# Patient Record
Sex: Male | Born: 1955 | Race: White | Hispanic: No | Marital: Married | Smoking: Current some day smoker
Health system: Southern US, Community
[De-identification: ages and names within clinical notes are randomized; demographics above are authoritative.]

## PROBLEM LIST (undated history)

## (undated) HISTORY — PX: APPENDECTOMY: SHX54

## (undated) HISTORY — PX: SHOULDER SURGERY: SHX246

## (undated) HISTORY — PX: CHOLECYSTECTOMY: SHX55

---

## 2017-09-22 ENCOUNTER — Ambulatory Visit (INDEPENDENT_AMBULATORY_CARE_PROVIDER_SITE_OTHER): Payer: Self-pay

## 2017-09-22 ENCOUNTER — Encounter: Payer: Self-pay | Admitting: *Deleted

## 2017-09-22 ENCOUNTER — Ambulatory Visit
Admission: EM | Admit: 2017-09-22 | Discharge: 2017-09-22 | Disposition: A | Discharge status: Home / Self Care | Payer: Self-pay | Attending: Family Medicine | Admitting: Family Medicine

## 2017-09-22 DIAGNOSIS — R0981 Nasal congestion: Secondary | ICD-10-CM

## 2017-09-22 DIAGNOSIS — J0100 Acute maxillary sinusitis, unspecified: Secondary | ICD-10-CM

## 2017-09-22 DIAGNOSIS — R05 Cough: Secondary | ICD-10-CM

## 2017-09-22 DIAGNOSIS — R059 Cough, unspecified: Secondary | ICD-10-CM

## 2017-09-22 LAB — RAPID STREP SCREEN (MED CTR MEBANE ONLY): Streptococcus, Group A Screen (Direct): NEGATIVE

## 2017-09-22 MED ORDER — PREDNISONE 10 MG PO TABS: ORAL_TABLET | ORAL | 0 refills | Status: AC

## 2017-09-22 MED ORDER — ALBUTEROL SULFATE HFA 108 (90 BASE) MCG/ACT IN AERS: 2.0000 | INHALATION_SPRAY | Freq: Four times a day (QID) | RESPIRATORY_TRACT | 0 refills | Status: AC | PRN

## 2017-09-22 MED ORDER — DOXYCYCLINE HYCLATE 100 MG PO CAPS: 100.0000 mg | ORAL_CAPSULE | Freq: Two times a day (BID) | ORAL | 0 refills | Status: AC

## 2017-09-22 MED ORDER — BENZONATATE 100 MG PO CAPS: 100.0000 mg | ORAL_CAPSULE | Freq: Three times a day (TID) | ORAL | 0 refills | Status: AC | PRN

## 2017-09-22 NOTE — Discharge Instructions (Signed)
Take medication as prescribed. Rest. Drink plenty of fluids.  ° °Follow up with your primary care physician this week as needed. Return to Urgent care for new or worsening concerns.  ° °

## 2017-09-22 NOTE — ED Provider Notes (Signed)
[facility] ____________________________________________  Time seen: Approximately 10:43 AM  I have reviewed the triage vital signs and the nursing notes.   HISTORY  Chief Complaint Cough   HPI Kenneth Simon is a 60 y.o. male presenting for evaluation of 9 days of runny nose, nasal congestion, sinus pressure, postnasal drainage, cough and body aches.  Patient reports gradual onset of sinus pressure sensation in his cheeks as well as worsening cough.  States cough is often productive of thick greenish mucus as well as when blowing his nose.  States symptoms unresolved with over-the-counter mucus medication.  Denies known fevers, but reports does have some body aches and chills.  Also states some sore throat, described as mild at this time.  States some intermittent chest tightness sensation with coughing and some soreness in bilateral ribs when coughing, but denies chest pain or shortness of breath.  Denies hemoptysis.  Denies known sick contacts.  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  Denies other aggravating or alleviating factors.  Chronic smoker.  Denies history of chronic bronchitis or COPD.  Reports overall continues to eat and drink well, though appetite is decreased.  Denies recent sickness or recent antibiotic use.  Denies chest pain, shortness of breath, abdominal pain, dysuria, extremity pain, extremity swelling or rash.    History reviewed. No pertinent past medical history.  There are no active problems to display for this patient.   Past Surgical History:  Procedure Laterality Date  . APPENDECTOMY    . CHOLECYSTECTOMY    . SHOULDER SURGERY       No current facility-administered medications for this encounter.   Current Outpatient Medications:  .  albuterol (PROVENTIL HFA;VENTOLIN HFA) 108 (90 Base) MCG/ACT inhaler, Inhale 2 puffs every 6 (six) hours as needed into the lungs for wheezing or shortness of breath., Disp: 1  Inhaler, Rfl: 0 .  benzonatate (TESSALON PERLES) 100 MG capsule, Take 1 capsule (100 mg total) 3 (three) times daily as needed by mouth for cough., Disp: 15 capsule, Rfl: 0 .  doxycycline (VIBRAMYCIN) 100 MG capsule, Take 1 capsule (100 mg total) 2 (two) times daily by mouth., Disp: 20 capsule, Rfl: 0 .  predniSONE (DELTASONE) 10 MG tablet, Start 60 mg po day one, then 50 mg po day two, taper by 10 mg daily until complete., Disp: 21 tablet, Rfl: 0  Allergies Patient has no allergy information on record.  History reviewed. No pertinent family history.  Social History Social History   Tobacco Use  . Smoking status: Current Some Day Smoker    Packs/day: 1.00    Types: Cigarettes  . Smokeless tobacco: Never Used  Substance Use Topics  . Alcohol use: No    Frequency: Never  . Drug use: No    Review of Systems Constitutional: As above ENT: Positive sore throat. Cardiovascular: Denies chest pain. Respiratory: Denies shortness of breath. Gastrointestinal: No abdominal pain.  No nausea, no vomiting.  Genitourinary: Negative for dysuria. Musculoskeletal: Negative for back pain. Skin: Negative for rash.   ____________________________________________   PHYSICAL EXAM:  VITAL SIGNS: ED Triage Vitals  Enc Vitals Group     BP 09/22/17 1011 133/90     Pulse Rate 09/22/17 1011 61     Resp 09/22/17 1011 12     Temp 09/22/17 1011 97.8 F (36.6 C)     Temp Source 09/22/17 1011 Oral     SpO2 09/22/17 1011 97 %     Weight 09/22/17 1013 237 lb (107.5  kg)     Height 09/22/17 1013 5\' 10"  (1.778 m)     Head Circumference --      Peak Flow --      Pain Score 09/22/17 1013 6     Pain Loc --      Pain Edu? --      Excl. in GC? --     Constitutional: Alert and oriented. Well appearing and in no acute distress. Eyes: Conjunctivae are normal.  Head: Atraumatic.Mild tenderness to palpation bilateral frontal and maxillary sinuses. No swelling. No erythema.   Ears: no erythema, normal TMs  bilaterally.   Nose: nasal congestion with bilateral nasal turbinate erythema and edema.   Mouth/Throat: Mucous membranes are moist.  Oropharynx non-erythematous. No tonsillar swelling or exudate.  Neck: No stridor.  No cervical spine tenderness to palpation. Hematological/Lymphatic/Immunilogical: No cervical lymphadenopathy. Cardiovascular: Normal rate, regular rhythm. Grossly normal heart sounds.  Good peripheral circulation. Respiratory: Normal respiratory effort.  No retractions. Mild scattered rhonchi.  No wheezes. No focal area of consolidation. Good air movement.  Dry intermittent cough noted with mild bronchospasm. Speaks in complete sentences.  Gastrointestinal: Soft and nontender.  Musculoskeletal: No lower or upper extremity tenderness nor edema.  No cervical, thoracic or lumbar tenderness to palpation.  Neurologic:  Normal speech and language. No gross focal neurologic deficits are appreciated. No gait instability. Skin:  Skin is warm, dry and intact. No rash noted. Psychiatric: Mood and affect are normal. Speech and behavior are normal.  ___________________________________________   LABS (all labs ordered are listed, but only abnormal results are displayed)  Labs Reviewed  RAPID STREP SCREEN (NOT AT [facility])  CULTURE, GROUP A STREP [facility])   RADIOLOGY  Dg Chest 2 View  Result Date: 09/22/2017 CLINICAL DATA:  Productive cough, chest congestion, and body aches for 9 days. EXAM: CHEST  2 VIEW COMPARISON:  None. FINDINGS: The heart size and mediastinal contours are within normal limits. Aortic atherosclerosis. Both lungs are clear. The visualized skeletal structures are unremarkable. IMPRESSION: No active cardiopulmonary disease. Electronically Signed   By: Myles Rosenthal M.D.   On: 09/22/2017 10:59   ____________________________________________   PROCEDURES Procedures    INITIAL IMPRESSION / ASSESSMENT AND PLAN / ED COURSE  Pertinent labs & imaging results that were available  during my care of the patient were reviewed by me and considered in my medical decision making (see chart for details).  Well-appearing patient.  No acute distress.  Quick strep negative, will culture.  Chest x-ray evaluated, no active cardiopulmonary disease per radiologist.  Suspect maxillary and frontal sinusitis, and cough.  Will treat patient with doxycycline, prednisone taper, as needed albuterol inhaler and as needed Tessalon Perles.  Encourage rest, fluids and supportive care.  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  Discussed strict follow-up and return parameters. Discussed indication, risks and benefits of medications with patient.    Discussed follow up with Primary care physician this week. Discussed follow up and return parameters including no resolution or any worsening concerns. Patient verbalized understanding and agreed to plan.   ____________________________________________   FINAL CLINICAL IMPRESSION(S) / ED DIAGNOSES  Final diagnoses:  Acute maxillary sinusitis, recurrence not specified  Cough     This SmartLink is deprecated. Use AVSMEDLIST instead to display the medication list for a patient.  Note: This dictation was prepared with Dragon dictation along with smaller phrase technology. Any transcriptional errors that result from this process  are unintentional.         Renford DillsMiller, [place], NP 09/22/17 1120

## 2017-09-25 LAB — CULTURE, GROUP A STREP (THRC)

## 2018-10-05 IMAGING — CR DG CHEST 2V
2 series · 2 of 2 positions shown · non-contrast
Comparison: None.

CLINICAL DATA: Productive cough, chest congestion, and body aches
for 9 days.

EXAM:
CHEST  2 VIEW

[chest pa]
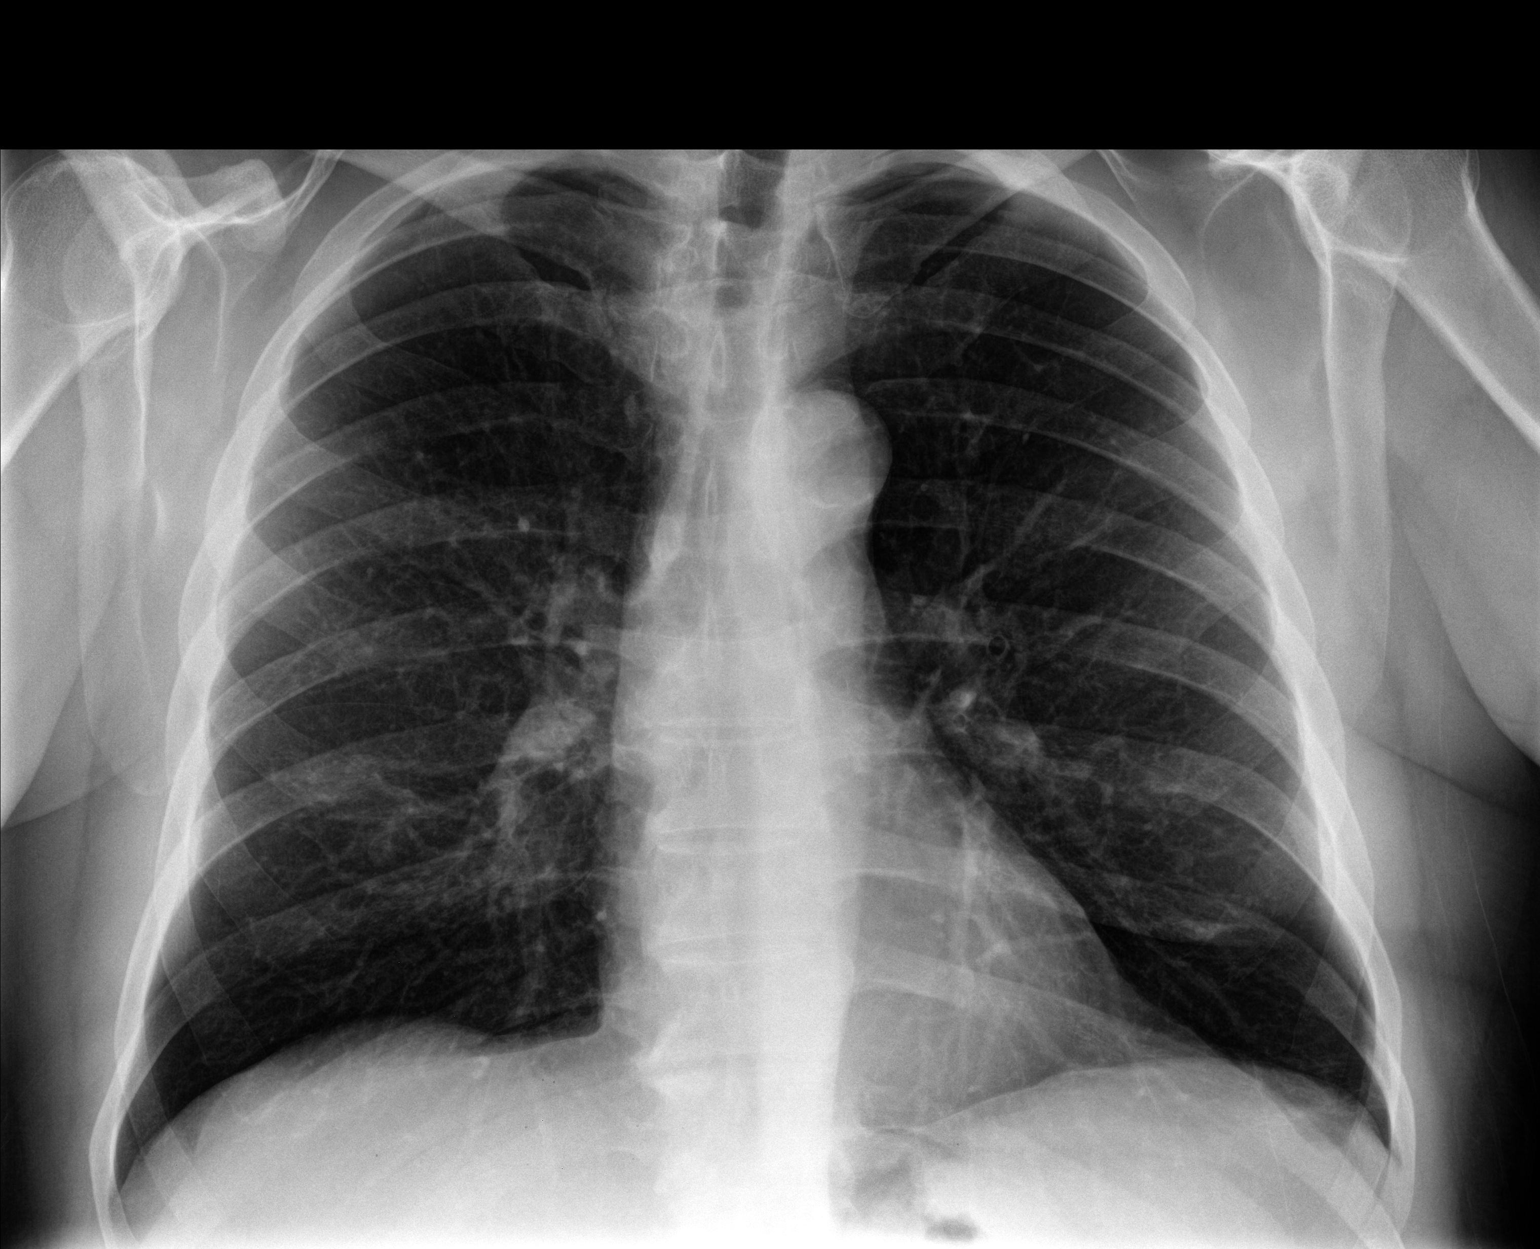

[chest lat]
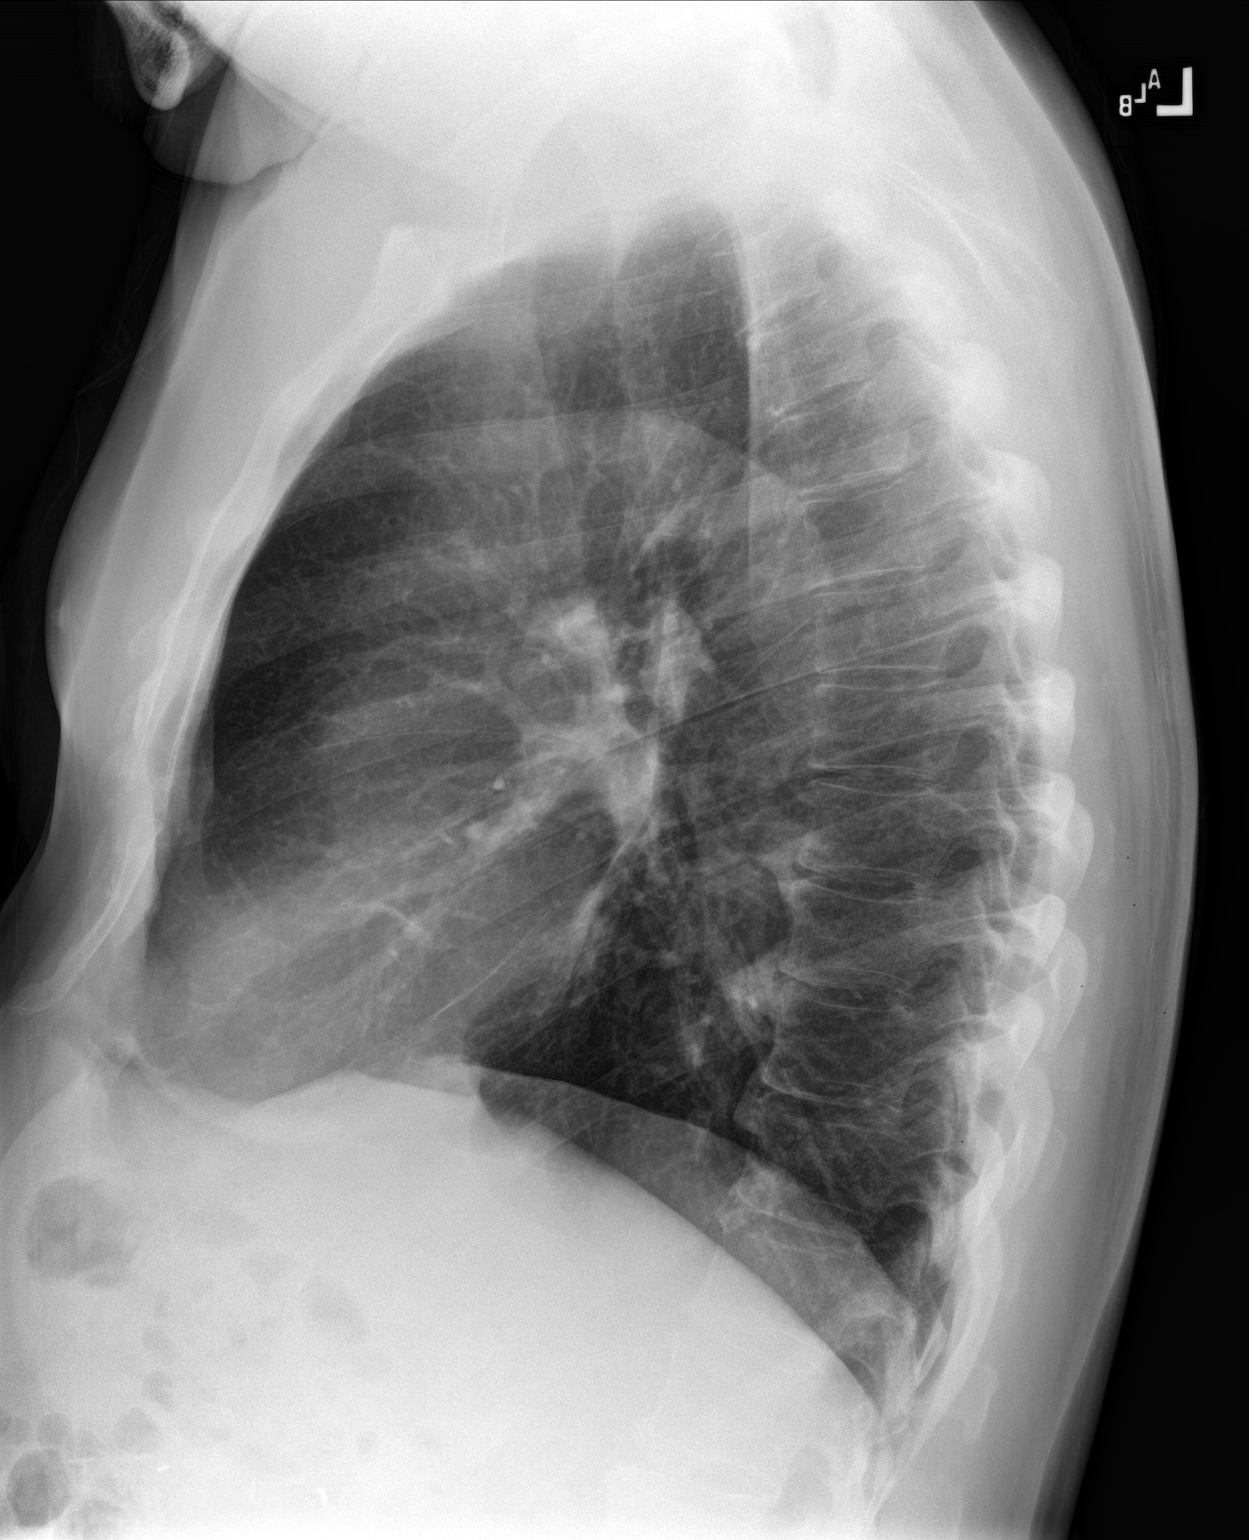

[2 of 2 positions shown; findings below may reference images not displayed]

FINDINGS: The heart size and mediastinal contours are within normal limits.
Aortic atherosclerosis. Both lungs are clear. The visualized
skeletal structures are unremarkable.
IMPRESSION: No active cardiopulmonary disease.
